# Patient Record
Sex: Male | Born: 1960 | Race: White | Hispanic: No | Marital: Married | Smoking: Never smoker
Health system: Southern US, Community
[De-identification: ages and names within clinical notes are randomized; demographics above are authoritative.]

## PROBLEM LIST (undated history)

## (undated) DIAGNOSIS — E559 Vitamin D deficiency, unspecified: Secondary | ICD-10-CM

## (undated) DIAGNOSIS — E785 Hyperlipidemia, unspecified: Secondary | ICD-10-CM

## (undated) DIAGNOSIS — M722 Plantar fascial fibromatosis: Secondary | ICD-10-CM

## (undated) DIAGNOSIS — E119 Type 2 diabetes mellitus without complications: Secondary | ICD-10-CM

## (undated) DIAGNOSIS — N529 Male erectile dysfunction, unspecified: Secondary | ICD-10-CM

## (undated) HISTORY — PX: COLONOSCOPY: SHX174

---

## 2009-09-12 ENCOUNTER — Ambulatory Visit: Payer: Self-pay | Admitting: Gastroenterology

## 2019-07-21 ENCOUNTER — Ambulatory Visit: Payer: Self-pay

## 2019-07-21 ENCOUNTER — Other Ambulatory Visit: Payer: Self-pay

## 2019-07-21 VITALS — BP 142/88 | HR 95 | Resp 18 | Ht 65.0 in | Wt 204.0 lb

## 2019-07-21 DIAGNOSIS — Z008 Encounter for other general examination: Secondary | ICD-10-CM

## 2019-07-21 LAB — POCT LIPID PANEL
HDL: 38
LDL: 54
Non-HDL: 74
POC Glucose: 174 mg/dl — AB (ref 70–99)
TC/HDL: 3
TC: 111
TRG: 99

## 2019-07-21 NOTE — Progress Notes (Signed)
     Patient ID: Jeremiah Perry, male    DOB: 27-Feb-1961, 58 y.o.   MRN: [id number]    Thank you!!  Apolonio Schneiders RN  [place] [place]: [phone]  Cell:  [phone] Website: [email or web address]

## 2020-05-11 ENCOUNTER — Other Ambulatory Visit: Admission: RE | Admit: 2020-05-11 | Payer: BC Managed Care – PPO | Source: Ambulatory Visit

## 2020-05-12 ENCOUNTER — Other Ambulatory Visit
Admission: RE | Admit: 2020-05-12 | Discharge: 2020-05-12 | Disposition: A | Discharge status: Home / Self Care | Payer: BC Managed Care – PPO | Source: Ambulatory Visit | Attending: Gastroenterology | Admitting: Gastroenterology

## 2020-05-12 ENCOUNTER — Other Ambulatory Visit: Payer: Self-pay

## 2020-05-12 DIAGNOSIS — Z20822 Contact with and (suspected) exposure to covid-19: Secondary | ICD-10-CM | POA: Diagnosis not present

## 2020-05-12 DIAGNOSIS — Z01812 Encounter for preprocedural laboratory examination: Secondary | ICD-10-CM | POA: Insufficient documentation

## 2020-05-12 LAB — SARS CORONAVIRUS 2 (TAT 6-24 HRS): SARS Coronavirus 2: NEGATIVE

## 2020-05-13 ENCOUNTER — Encounter
Admission: RE | Disposition: A | Discharge status: Home / Self Care | Payer: Self-pay | Source: Home / Self Care | Attending: Gastroenterology

## 2020-05-13 ENCOUNTER — Other Ambulatory Visit: Payer: Self-pay

## 2020-05-13 ENCOUNTER — Ambulatory Visit: Payer: BC Managed Care – PPO | Admitting: Certified Registered Nurse Anesthetist

## 2020-05-13 ENCOUNTER — Ambulatory Visit
Admission: RE | Admit: 2020-05-13 | Discharge: 2020-05-13 | Disposition: A | Discharge status: Home / Self Care | Payer: BC Managed Care – PPO | Attending: Gastroenterology | Admitting: Gastroenterology

## 2020-05-13 ENCOUNTER — Encounter: Payer: Self-pay | Admitting: *Deleted

## 2020-05-13 DIAGNOSIS — E119 Type 2 diabetes mellitus without complications: Secondary | ICD-10-CM | POA: Diagnosis not present

## 2020-05-13 DIAGNOSIS — N529 Male erectile dysfunction, unspecified: Secondary | ICD-10-CM | POA: Insufficient documentation

## 2020-05-13 DIAGNOSIS — D122 Benign neoplasm of ascending colon: Secondary | ICD-10-CM | POA: Insufficient documentation

## 2020-05-13 DIAGNOSIS — K640 First degree hemorrhoids: Secondary | ICD-10-CM | POA: Insufficient documentation

## 2020-05-13 DIAGNOSIS — Z7984 Long term (current) use of oral hypoglycemic drugs: Secondary | ICD-10-CM | POA: Insufficient documentation

## 2020-05-13 DIAGNOSIS — Z79899 Other long term (current) drug therapy: Secondary | ICD-10-CM | POA: Diagnosis not present

## 2020-05-13 DIAGNOSIS — E785 Hyperlipidemia, unspecified: Secondary | ICD-10-CM | POA: Diagnosis not present

## 2020-05-13 DIAGNOSIS — Z1211 Encounter for screening for malignant neoplasm of colon: Secondary | ICD-10-CM | POA: Insufficient documentation

## 2020-05-13 DIAGNOSIS — D123 Benign neoplasm of transverse colon: Secondary | ICD-10-CM | POA: Insufficient documentation

## 2020-05-13 HISTORY — DX: Male erectile dysfunction, unspecified: N52.9

## 2020-05-13 HISTORY — DX: Plantar fascial fibromatosis: M72.2

## 2020-05-13 HISTORY — DX: Hyperlipidemia, unspecified: E78.5

## 2020-05-13 HISTORY — DX: Vitamin D deficiency, unspecified: E55.9

## 2020-05-13 HISTORY — PX: COLONOSCOPY: SHX5424

## 2020-05-13 HISTORY — DX: Type 2 diabetes mellitus without complications: E11.9

## 2020-05-13 LAB — GLUCOSE, CAPILLARY: Glucose-Capillary: 97 mg/dL (ref 70–99)

## 2020-05-13 SURGERY — COLONOSCOPY
Anesthesia: General

## 2020-05-13 MED ORDER — PROPOFOL 500 MG/50ML IV EMUL
INTRAVENOUS | Status: DC | PRN
  Administered 2020-05-13: 100 ug/kg/min via INTRAVENOUS

## 2020-05-13 MED ORDER — PROPOFOL 10 MG/ML IV BOLUS
INTRAVENOUS | Status: DC | PRN
  Administered 2020-05-13: 50 mg via INTRAVENOUS

## 2020-05-13 MED ORDER — LIDOCAINE HCL (CARDIAC) PF 100 MG/5ML IV SOSY
PREFILLED_SYRINGE | INTRAVENOUS | Status: DC | PRN
  Administered 2020-05-13: 30 mg via INTRAVENOUS

## 2020-05-13 MED ORDER — SODIUM CHLORIDE 0.9 % IV SOLN: INTRAVENOUS | Status: DC

## 2020-05-13 MED ORDER — PROPOFOL 10 MG/ML IV BOLUS
INTRAVENOUS | Status: AC
  Filled 2020-05-13: qty 40

## 2020-05-13 NOTE — Interval H&P Note (Signed)
History and Physical Interval Note:  05/13/2020 12:37 PM  Jeremiah Perry  has presented today for surgery, with the diagnosis of COLON CANCER SCREENING.  The various methods of treatment have been discussed with the patient and family. After consideration of risks, benefits and other options for treatment, the patient has consented to  Procedure(s): COLONOSCOPY (N/A) as a surgical intervention.  The patient's history has been reviewed, patient examined, no change in status, stable for surgery.  I have reviewed the patient's chart and labs.  Questions were answered to the patient's satisfaction.     Lesly Rubenstein  Ok to proceed with colonoscopy

## 2020-05-13 NOTE — Interval H&P Note (Signed)
History and Physical Interval Note:  05/13/2020 12:28 PM  Jeremiah Perry  has presented today for surgery, with the diagnosis of COLON CANCER SCREENING.  The various methods of treatment have been discussed with the patient and family. After consideration of risks, benefits and other options for treatment, the patient has consented to  Procedure(s): COLONOSCOPY (N/A) as a surgical intervention.  The patient's history has been reviewed, patient examined, no change in status, stable for surgery.  I have reviewed the patient's chart and labs.  Questions were answered to the patient's satisfaction.     Lesly Rubenstein  Ok to proceed with colonoscopy.

## 2020-05-13 NOTE — Transfer of Care (Signed)
Immediate Anesthesia Transfer of Care Note  Patient: Jeremiah Perry  Procedure(s) Performed: COLONOSCOPY (N/A )  Patient Location: PACU  Anesthesia Type:General  Level of Consciousness: awake, alert  and oriented  Airway & Oxygen Therapy: Patient Spontanous Breathing  Post-op Assessment: Report given to RN  Post vital signs: Reviewed and stable  Last Vitals:  Vitals Value Taken Time  BP    Temp    Pulse 84 05/13/20 1315  Resp 11 05/13/20 1315  SpO2 98 % 05/13/20 1315  Vitals shown include unvalidated device data.  Last Pain:  Vitals:   05/13/20 1313  TempSrc: (P) Temporal  PainSc:          Complications: No complications documented.

## 2020-05-13 NOTE — Op Note (Signed)
[facility] Patient Name: Jeremiah Perry Procedure Date: 05/13/2020 12:46 PM MRN: [id number] Account #: [id number] Date of Birth: [birth year] Admit Type: Outpatient Age: 59 Room: [facility] ENDO ROOM 3 Gender: Male Note Status: Finalized Procedure:             Colonoscopy Indications:           Screening for colorectal malignant neoplasm Providers:             Andrey Farmer MD, MD Referring MD:          No Local Md, MD (Referring MD) Medicines:             Monitored Anesthesia Care Complications:         No immediate complications. Estimated blood loss:                         Minimal. Procedure:             Pre-Anesthesia Assessment:                        - Prior to the procedure, a History and Physical was                         performed, and patient medications and allergies were                         reviewed. The patient is competent. The risks and                         benefits of the procedure and the sedation options and                         risks were discussed with the patient. All questions                         were answered and informed consent was obtained.                         Patient identification and proposed procedure were                         verified by the physician, the nurse, the anesthetist                         and the technician in the endoscopy suite. Mental                         Status Examination: alert and oriented. Airway                         Examination: normal oropharyngeal airway and neck                         mobility. Respiratory Examination: clear to                         auscultation. CV Examination: normal. Prophylactic  Antibiotics: The patient does not require prophylactic                         antibiotics. Prior Anticoagulants: The patient has                         taken no previous anticoagulant or antiplatelet                         agents. ASA  Grade Assessment: II - A patient with mild                         systemic disease. After reviewing the risks and                         benefits, the patient was deemed in satisfactory                         condition to undergo the procedure. The anesthesia                         plan was to use monitored anesthesia care (MAC).                         Immediately prior to administration of medications,                         the patient was re-assessed for adequacy to receive                         sedatives. The heart rate, respiratory rate, oxygen                         saturations, blood pressure, adequacy of pulmonary                         ventilation, and response to care were monitored                         throughout the procedure. The physical status of the                         patient was re-assessed after the procedure.                        After obtaining informed consent, the colonoscope was                         passed under direct vision. Throughout the procedure,                         the patient's blood pressure, pulse, and oxygen                         saturations were monitored continuously. The                         Colonoscope was introduced through the anus and  advanced to the the cecum, identified by appendiceal                         orifice and ileocecal valve. The colonoscopy was                         performed without difficulty. The patient tolerated                         the procedure well. The quality of the bowel                         preparation was good. Findings:      The perianal and digital rectal examinations were normal.      A 2 mm polyp was found in the ascending colon. The polyp was sessile.       The polyp was removed with a jumbo cold forceps. Resection and retrieval       were complete. Estimated blood loss was minimal.      A 1 mm polyp was found in the transverse colon. The polyp was  sessile.       The polyp was removed with a jumbo cold forceps. Resection and retrieval       were complete. Estimated blood loss was minimal.      A 1 mm polyp was found in the mid transverse colon. The polyp was       sessile. The polyp was removed with a jumbo cold forceps. Resection and       retrieval were complete. Estimated blood loss was minimal.      Non-bleeding internal hemorrhoids were found during retroflexion. The       hemorrhoids were Grade I (internal hemorrhoids that do not prolapse).      The exam was otherwise without abnormality on direct and retroflexion       views. Impression:            - One 2 mm polyp in the ascending colon, removed with                         a jumbo cold forceps. Resected and retrieved.                        - One 1 mm polyp in the transverse colon, removed with                         a jumbo cold forceps. Resected and retrieved.                        - One 1 mm polyp in the mid transverse colon, removed                         with a jumbo cold forceps. Resected and retrieved.                        - Non-bleeding internal hemorrhoids.                        - The examination was otherwise normal on direct and  retroflexion views. Recommendation:        - Discharge patient to home.                        - Resume previous diet.                        - Continue present medications.                        - Await pathology results.                        - Repeat colonoscopy date to be determined after                         pending pathology results are reviewed for                         surveillance based on pathology results.                        - Return to referring physician as previously                         scheduled. Procedure Code(s):     --- Professional ---                        [phone], Colonoscopy, flexible; with biopsy, single or                         multiple Diagnosis Code(s):     ---  Professional ---                        Z12.11, Encounter for screening for malignant neoplasm                         of colon                        K63.5, Polyp of colon                        K64.0, First degree hemorrhoids CPT copyright 2019 American Medical Association. All rights reserved. The codes documented in this report are preliminary and upon coder review may  be revised to meet current compliance requirements. Andrey Farmer, MD Andrey Farmer MD, MD 05/13/2020 1:13:28 PM Number of Addenda: 0 Note Initiated On: 05/13/2020 12:46 PM Scope Withdrawal Time: 0 hours 14 minutes 24 seconds  Total Procedure Duration: 0 hours 18 minutes 4 seconds  Estimated Blood Loss:  Estimated blood loss was minimal.      [facility]

## 2020-05-13 NOTE — Anesthesia Postprocedure Evaluation (Signed)
Anesthesia Post Note  Patient: Jeremiah Perry  Procedure(s) Performed: COLONOSCOPY (N/A )  Patient location during evaluation: Endoscopy Anesthesia Type: General Level of consciousness: awake and alert and oriented Pain management: pain level controlled Vital Signs Assessment: post-procedure vital signs reviewed and stable Respiratory status: spontaneous breathing, nonlabored ventilation and respiratory function stable Cardiovascular status: blood pressure returned to baseline and stable Postop Assessment: no signs of nausea or vomiting Anesthetic complications: no   No complications documented.   Last Vitals:  Vitals:   05/13/20 1313 05/13/20 1323  BP: 105/69 124/73  Pulse:    Resp: 16   Temp: 36.7 C   SpO2:      Last Pain:  Vitals:   05/13/20 1323  TempSrc:   PainSc: 0-No pain                 Miquel Lamson

## 2020-05-13 NOTE — Anesthesia Preprocedure Evaluation (Signed)
Anesthesia Evaluation  Patient identified by MRN, date of birth, ID band Patient awake    Reviewed: Allergy & Precautions, NPO status , Patient's Chart, lab work & pertinent test results  History of Anesthesia Complications Negative for: history of anesthetic complications  Airway Mallampati: II  TM Distance: >3 FB Neck ROM: Full    Dental no notable dental hx.    Pulmonary neg pulmonary ROS, neg sleep apnea, neg COPD,    breath sounds clear to auscultation- rhonchi (-) wheezing      Cardiovascular Exercise Tolerance: Good (-) hypertension(-) CAD, (-) Past MI, (-) Cardiac Stents and (-) CABG  Rhythm:Regular Rate:Normal - Systolic murmurs and - Diastolic murmurs    Neuro/Psych neg Seizures negative neurological ROS  negative psych ROS   GI/Hepatic negative GI ROS, Neg liver ROS,   Endo/Other  diabetes, Oral Hypoglycemic Agents  Renal/GU negative Renal ROS     Musculoskeletal negative musculoskeletal ROS (+)   Abdominal (+) + obese,   Peds  Hematology negative hematology ROS (+)   Anesthesia Other Findings Past Medical History: No date: Diabetes mellitus without complication (HCC) No date: ED (erectile dysfunction) No date: Hyperlipidemia No date: Hyperlipidemia No date: Plantar fasciitis No date: Vitamin D deficiency   Reproductive/Obstetrics                             Anesthesia Physical Anesthesia Plan  ASA: II  Anesthesia Plan: General   Post-op Pain Management:    Induction: Intravenous  PONV Risk Score and Plan: 1 and Propofol infusion  Airway Management Planned: Natural Airway  Additional Equipment:   Intra-op Plan:   Post-operative Plan:   Informed Consent: I have reviewed the patients History and Physical, chart, labs and discussed the procedure including the risks, benefits and alternatives for the proposed anesthesia with the patient or authorized  representative who has indicated his/her understanding and acceptance.     Dental advisory given  Plan Discussed with: CRNA and Anesthesiologist  Anesthesia Plan Comments:         Anesthesia Quick Evaluation

## 2020-05-13 NOTE — H&P (Signed)
Outpatient short stay form Pre-procedure 05/13/2020 12:25 PM Raylene Miyamoto MD, MPH  Primary Physician: None listed  Reason for visit:  Screening Colonoscopy  History of present illness:   59 y/o gentleman here for screening colonoscopy. No blood thinners. No family history of GI malignancies.    Current Facility-Administered Medications:  .  0.9 %  sodium chloride infusion, , Intravenous, Continuous, Cavan Bearden, Hilton Cork, MD  Medications Prior to Admission  Medication Sig Dispense Refill Last Dose  . APOAEQUORIN PO Take by mouth daily.     . CHOLECALCIFEROL PO Take 200 Units by mouth daily.     . hydrochlorothiazide (HYDRODIURIL) 12.5 MG tablet Take 12.5 mg by mouth daily.     Marland Kitchen losartan (COZAAR) 100 MG tablet Take 100 mg by mouth daily.     . metFORMIN (GLUCOPHAGE-XR) 500 MG 24 hr tablet Take 500 mg by mouth daily with breakfast.     . sildenafil (REVATIO) 20 MG tablet Take 20 mg by mouth 3 (three) times daily.     . simvastatin (ZOCOR) 40 MG tablet Take 40 mg by mouth daily.        Not on File   Past Medical History:  Diagnosis Date  . Diabetes mellitus without complication ([facility])   . ED (erectile dysfunction)   . Hyperlipidemia   . Plantar fasciitis   . Vitamin D deficiency     Review of systems:  Otherwise negative.    Physical Exam  Gen: Alert, oriented. Appears stated age.  HEENT: [place]/AT. PERRLA. Lungs: No respiratory distress Abd: soft, benign, no masses. Ext: No edema.    Planned procedures: Proceed with colonoscopy. The patient understands the nature of the planned procedure, indications, risks, alternatives and potential complications including but not limited to bleeding, infection, perforation, damage to internal organs and possible oversedation/side effects from anesthesia. The patient agrees and gives consent to proceed.  Please refer to procedure notes for findings, recommendations and patient disposition/instructions.     Raylene Miyamoto MD,  [facility] 05/13/2020  12:25 PM

## 2020-05-16 ENCOUNTER — Encounter: Payer: Self-pay | Admitting: Gastroenterology

## 2020-05-16 LAB — SURGICAL PATHOLOGY

## 2021-12-26 ENCOUNTER — Other Ambulatory Visit: Payer: Self-pay | Admitting: Family Medicine

## 2021-12-26 DIAGNOSIS — M5416 Radiculopathy, lumbar region: Secondary | ICD-10-CM

## 2022-01-03 ENCOUNTER — Ambulatory Visit
Admission: RE | Admit: 2022-01-03 | Discharge: 2022-01-03 | Disposition: A | Discharge status: Home / Self Care | Payer: BC Managed Care – PPO | Source: Ambulatory Visit | Attending: Family Medicine | Admitting: Family Medicine

## 2022-01-03 DIAGNOSIS — M5416 Radiculopathy, lumbar region: Secondary | ICD-10-CM

## 2022-01-24 ENCOUNTER — Ambulatory Visit: Payer: BC Managed Care – PPO | Admitting: Urology

## 2022-01-24 ENCOUNTER — Encounter: Payer: Self-pay | Admitting: Urology

## 2022-01-24 VITALS — BP 134/81 | HR 102 | Ht 65.0 in | Wt 197.2 lb

## 2022-01-24 DIAGNOSIS — N529 Male erectile dysfunction, unspecified: Secondary | ICD-10-CM

## 2022-01-24 DIAGNOSIS — R972 Elevated prostate specific antigen [PSA]: Secondary | ICD-10-CM

## 2022-01-24 NOTE — Progress Notes (Signed)
? ?  01/24/22 ?2:39 PM  ? ?Jeremiah Perry ?April 10, 1961 ?[id number] ? ?CC: Elevated PSA, ED ? ?HPI: ?I saw Jeremiah Perry today for evaluation of elevated PSA.  He denies any family history of prostate cancer, reportedly has a history of BPH that required surgery in his brother.  Most recent PSA on 12/15/2021 was elevated at 4.98, which had increased from 3.86 in September 2021, and 2.58 in August 2019.  He denies any urinary symptoms or gross hematuria.  He has ED well-controlled on sildenafil.  He is originally from [place]. ? ? ?PMH: ?Past Medical History:  ?Diagnosis Date  ? Diabetes mellitus without complication ([facility])   ? ED (erectile dysfunction)   ? Hyperlipidemia   ? Hyperlipidemia   ? Plantar fasciitis   ? Vitamin D deficiency   ? ? ?Surgical History: ?Past Surgical History:  ?Procedure Laterality Date  ? COLONOSCOPY    ? COLONOSCOPY N/A 05/13/2020  ? Procedure: COLONOSCOPY;  Surgeon: Lesly Rubenstein, MD;  Location[facility]COPY;  Service: Endoscopy;  Laterality: N/A;  ? ? ?Social History:  reports that he has never smoked. He does not have any smokeless tobacco history on file. He reports that he does not drink alcohol and does not use drugs. ? ?Physical Exam: ?BP 134/81 (BP Location: Left Arm, Patient Position: Sitting, Cuff Size: Large)   Pulse (!) 102   Ht '5\' 5"'$  (1.651 m)   Wt 197 lb 3.2 oz (89.4 kg)   BMI 32.82 kg/m?   ? ?Constitutional:  Alert and oriented, No acute distress. ?Cardiovascular: No clubbing, cyanosis, or edema. ?Respiratory: Normal respiratory effort, no increased work of breathing. ?GI: Abdomen is soft, nontender, nondistended, no abdominal masses ?DRE: 60 g, smooth, no nodules or masses ? ?Laboratory Data: ?Reviewed, see HPI ? ?Assessment & Plan:   ?61 year old male with no family history of prostate cancer, mildly elevated PSA of 4.98 which is slowly increased over the last few years, normal DRE. ? ?We reviewed the implications of an elevated PSA and the uncertainty  surrounding it. In general, a man's PSA increases with age and is produced by both normal and cancerous prostate tissue. The differential diagnosis for elevated PSA includes BPH, prostate cancer, infection, recent intercourse/ejaculation, recent urethroscopic manipulation (foley placement/cystoscopy) or trauma, and prostatitis.  ? ?Management of an elevated PSA can include observation or prostate biopsy and we discussed this in detail. Our goal is to detect clinically significant prostate cancers, and manage with either active surveillance, surgery, or radiation for localized disease. Risks of prostate biopsy include bleeding, infection (including life threatening sepsis), pain, and lower urinary symptoms. Hematuria, hematospermia, and blood in the stool are all common after biopsy and can persist up to 4 weeks.  ? ?We discussed options of repeat PSA with reflex to free, prostate MRI, or prostate biopsy.  Using shared decision making he opted for a prostate MRI, and understands possible need for biopsy pending MRI findings. ? ? ?Nickolas Madrid, MD ?01/24/2022 ? ?[place] ?[address] ?[place], [place] [address] ?[phone] ? ?

## 2022-01-24 NOTE — Patient Instructions (Signed)

## 2024-01-22 ENCOUNTER — Ambulatory Visit: Admitting: Urology

## 2024-01-22 VITALS — BP 147/79 | HR 71 | Ht 65.0 in | Wt 194.2 lb

## 2024-01-22 DIAGNOSIS — N529 Male erectile dysfunction, unspecified: Secondary | ICD-10-CM

## 2024-01-22 DIAGNOSIS — R972 Elevated prostate specific antigen [PSA]: Secondary | ICD-10-CM

## 2024-01-22 DIAGNOSIS — R399 Unspecified symptoms and signs involving the genitourinary system: Secondary | ICD-10-CM | POA: Diagnosis not present

## 2024-01-22 NOTE — Progress Notes (Signed)
   01/22/24 2:49 PM   Jeremiah Perry 1961-06-03 [id number]  CC: Elevated PSA, ED, urinary symptoms  HPI: 63 year old male with persistently rising PSA over the last few years.  PSA was normal at 2.6 in August 2019, 3.86 September 2021, 4.98 March 2023, 6.08 January 2023, 5.76 October 2024, and most recently 9.65 in April 2025.  Mild urinary symptoms of urgency during the day, nocturia 0-1 time at night.  He has ED well-controlled with sildenafil. He has never been evaluated by urology before.  No prior cross-sectional imaging to review.  No prior abdominal surgeries.   PMH: Past Medical History:  Diagnosis Date   Diabetes mellitus without complication (HCC)    ED (erectile dysfunction)    Hyperlipidemia    Hyperlipidemia    Plantar fasciitis    Vitamin D deficiency     Surgical History: Past Surgical History:  Procedure Laterality Date   COLONOSCOPY     COLONOSCOPY N/A 05/13/2020   Procedure: COLONOSCOPY;  Surgeon: Shane Darling, MD;  Location: [facility] ENDOSCOPY;  Service: Endoscopy;  Laterality: N/A;    Social History:  reports that he has never smoked. He does not have any smokeless tobacco history on file. He reports that he does not drink alcohol and does not use drugs.  Physical Exam: BP (!) 147/79 (BP Location: Left Arm, Patient Position: Sitting, Cuff Size: Normal)   Pulse 71   Ht 5\' 5"  (1.651 m)   Wt 194 lb 3.2 oz (88.1 kg)   SpO2 98%   BMI 32.32 kg/m    Constitutional:  Alert and oriented, No acute distress. Cardiovascular: No clubbing, cyanosis, or edema. Respiratory: Normal respiratory effort, no increased work of breathing. GI: Abdomen is soft, nontender, nondistended, no abdominal masses   Laboratory Data: PSA history reviewed, see HPI  Pertinent Imaging: None to review  Assessment & Plan:   63 year old relatively healthy male with persistently rising PSA over the last 2 years, most recently 9.65.  We reviewed the implications of an elevated  PSA and the uncertainty surrounding it. In general, a man's PSA increases with age and is produced by both normal and cancerous prostate tissue. The differential diagnosis for elevated PSA includes BPH, prostate cancer, infection/prostatitis, recent intercourse/ejaculation, recent urethroscopic manipulation (foley placement/cystoscopy) or trauma. Management of an elevated PSA can include observation/surveillance, prostate MRI, or prostate biopsy and we discussed this in detail. Our goal is to detect clinically significant prostate cancers, and manage with either active surveillance, surgery, or radiation for localized disease. Risks of prostate biopsy include bleeding, infection (including life threatening sepsis), pain, and lower urinary symptoms. Hematuria, hematospermia, and blood in the stool are all common after biopsy and can persist up to 4 weeks.  Schedule prostate biopsy  Jay Meth, MD 01/22/2024  [facility] [address], [address] [place], [place] [address] [phone]

## 2024-01-22 NOTE — Patient Instructions (Signed)
 Prostate Biopsy Instructions  Stop all aspirin or blood thinners (aspirin, plavix, coumadin, warfarin, motrin, ibuprofen, advil, aleve, naproxen, naprosyn) for 7 days prior to the procedure.  If you have any questions about stopping these medications, please contact your primary care physician or cardiologist.  Having a light meal prior to the procedure is recommended.  If you are diabetic or have low blood sugar please bring a small snack or glucose tablet.  A Fleets enema is needed to be purchased over the counter at a local pharmacy and used 2 hours before you scheduled appointment.  This can be purchased over the counter at any pharmacy.  Antibiotics will be administered in the clinic at the time of the procedure unless otherwise specified.    Please bring someone with you to the procedure to drive you home.  A follow up appointment has been scheduled for you to receive the results of the biopsy.  If you have any questions or concerns, please feel free to call the office at [phone] or send a Mychart message.    Thank you, Staff at [facility]  Prostate Cancer Screening  Prostate cancer screening is testing that is done to check for the presence of prostate cancer in men. The prostate gland is a walnut-sized gland that is located below the bladder and in front of the rectum in males. The function of the prostate is to add fluid to semen during ejaculation. Prostate cancer is one of the most common types of cancer in men. Who should have prostate cancer screening? Screening recommendations vary based on age and other risk factors, as well as between the professional organizations who make the recommendations. In general, screening is recommended if: You are age 72 to 46 and have an average risk for prostate cancer. You should talk with your health care provider about your need for screening and how often screening should be done. Because most prostate cancers are slow  growing and will not cause death, screening in this age group is generally reserved for men who have a 10- to 15-year life expectancy. You are younger than age 59, and you have these risk factors: Having a father, brother, or uncle who has been diagnosed with prostate cancer. The risk is higher if your family member's cancer occurred at an early age or if you have multiple family members with prostate cancer at an early age. Being a male who is [place] or is of [place] or sub-Saharan African descent. In general, screening is not recommended if: You are younger than age 71. You are between the ages of 50 and 18 and you have no risk factors. You are 66 years of age or older. At this age, the risks that screening can cause are greater than the benefits that it may provide. If you are at high risk for prostate cancer, your health care provider may recommend that you have screenings more often or that you start screening at a younger age. How is screening for prostate cancer done? The recommended prostate cancer screening test is a blood test called the prostate-specific antigen (PSA) test. PSA is a protein that is made in the prostate. As you age, your prostate naturally produces more PSA. Abnormally high PSA levels may be caused by: Prostate cancer. An enlarged prostate that is not caused by cancer (benign prostatic hyperplasia, or BPH). This condition is very common in older men. A prostate gland infection (prostatitis) or urinary tract infection. Certain medicines such as male hormones (like testosterone)  or other medicines that raise testosterone levels. A rectal exam may be done as part of prostate cancer screening to help provide information about the size of your prostate gland. When a rectal exam is performed, it should be done after the PSA level is drawn to avoid any effect on the results. Depending on the PSA results, you may need more tests, such as: A physical exam to check the size of your  prostate gland, if not done as part of screening. Blood and imaging tests. A procedure to remove tissue samples from your prostate gland for testing (biopsy). This is the only way to know for certain if you have prostate cancer. What are the benefits of prostate cancer screening? Screening can help to identify cancer at an early stage, before symptoms start and when the cancer can be treated more easily. There is a small chance that screening may lower your risk of dying from prostate cancer. The chance is small because prostate cancer is a slow-growing cancer, and most men with prostate cancer die from a different cause. What are the risks of prostate cancer screening? The main risk of prostate cancer screening is diagnosing and treating prostate cancer that would never have caused any symptoms or problems. This is called overdiagnosisand overtreatment. PSA screening cannot tell you if your PSA is high due to cancer or a different cause. A prostate biopsy is the only procedure to diagnose prostate cancer. Even the results of a biopsy may not tell you if your cancer needs to be treated. Slow-growing prostate cancer may not need any treatment other than monitoring, so diagnosing and treating it may cause unnecessary stress or other side effects. Questions to ask your health care provider When should I start prostate cancer screening? What is my risk for prostate cancer? How often do I need screening? What type of screening tests do I need? How do I get my test results? What do my results mean? Do I need treatment? Where to find more information The American Cancer Society: www.cancer.org American Urological Association: www.auanet.org Contact a health care provider if: You have difficulty urinating. You have pain when you urinate or ejaculate. You have blood in your urine or semen. You have pain in your back or in the area of your prostate. Summary Prostate cancer is a common type of cancer  in men. The prostate gland is located below the bladder and in front of the rectum. This gland adds fluid to semen during ejaculation. Prostate cancer screening may identify cancer at an early stage, when the cancer can be treated more easily and is less likely to have spread to other areas of the body. The prostate-specific antigen (PSA) test is the recommended screening test for prostate cancer, but it has associated risks. Discuss the risks and benefits of prostate cancer screening with your health care provider. If you are age 100 or older, the risks that screening can cause are greater than the benefits that it may provide. This information is not intended to replace advice given to you by your health care provider. Make sure you discuss any questions you have with your health care provider. Document Revised: 03/13/2021 Document Reviewed: 03/13/2021 Elsevier Patient Education  2024 [employer]vier Inc.  Transrectal Ultrasound-Guided Prostate Biopsy An ultrasound is a test that uses sound waves to take pictures of the inside of the body. During this test, a small device (probe) with a gel is put inside your butt (rectum). The probe will take pictures of your prostate to  help guide the needle. A needle will be inserted to take samples of tissues for testing. This test may be done to check for changes in the prostate, like prostate swelling or cancer. Tell your doctor about: Any allergies you have. All medicines you are taking. Any problems you or family members have had with anesthetic medicines. Any bleeding problems you have. Any surgeries you have had. Any medical conditions you have. Any prostate infections you have had. What are the risks? Infection. Bleeding from the butt. Blood in the pee (urine). Allergic reactions to medicines. Damage to nearby parts. Trouble peeing. Nerve damage. This is usually temporary. What happens before the test? Medicines Ask your doctor about changing or  stopping: Your normal medicines. Vitamins, herbs, and supplements. Over-the-counter medicines. Do not take aspirin or ibuprofen unless you are told to. General instructions Follow instructions from your doctor about what you cannot eat or drink. Liquid will be used to clear waste from your butt (enema). You may have a blood sample taken. You may have a pee sample taken. For your safety, your doctor may: Ask you to wash with a soap that kills germs. Give you antibiotic medicine. If you will be going home right after the test, plan to have a responsible adult: Take you home from the hospital or clinic. You will not be allowed to drive. Care for you for the time you are told. What happens during the test?  An IV tube will be put into one of your veins. You will be given one or both of these: A medicine to help you relax. A medicine to numb the area. You will be placed on your left side. Your knees will be bent toward your chest. A probe with gel on it will be placed in your butt. Pictures will be taken of your prostate and the area around it. Medicine will be used to numb your prostate. A needle will be placed in your butt and moved to your prostate. Prostate tissue will be taken out. The needle and probe will be taken out. The samples will be sent to a lab. The procedure may vary among doctors and hospitals. What happens after the test? You will be watched until you leave the hospital or clinic. This includes checking your blood pressure, heart rate, breathing rate, and blood oxygen level. You may have some pain in your butt. You will be given medicine for it. If you were given a sedative during the test, do not drive or use machines until your doctor says that it is safe. A sedative is a medicine that helps you relax. It is up to you to get the results of your test. Ask how to get your results when they are ready. Summary This test is usually done to check for prostate  cancer. Before the test, ask your doctor about changing or stopping your medicines. You may have some pain in your butt. You will be given medicine for it. Plan to have a responsible adult take you home from the hospital or clinic. This information is not intended to replace advice given to you by your health care provider. Make sure you discuss any questions you have with your health care provider. Document Revised: 03/13/2021 Document Reviewed: 03/13/2021 Elsevier Patient Education  2024 [employer].

## 2024-01-24 IMAGING — MR MR LUMBAR SPINE W/O CM
4 of 5 series · 17 of 48 positions shown · non-contrast
Comparison: Report from lumbar spine radiographs 08/25/2021 (images
unavailable).

CLINICAL DATA: Lumbar radiculitis. Additional history provided by
scanning technologist: Patient reports low back pain. Pain on both
sides but worse in left side of back.

EXAM:
MRI LUMBAR SPINE WITHOUT CONTRAST
TECHNIQUE: Multiplanar, multisequence MR imaging of the lumbar spine was
performed. No intravenous contrast was administered.

[Series 6: T2 · sagittal · 4.0mm · 0.73mm/px · 6 of 15 slices shown (1 of 2)]
[im 1/15]
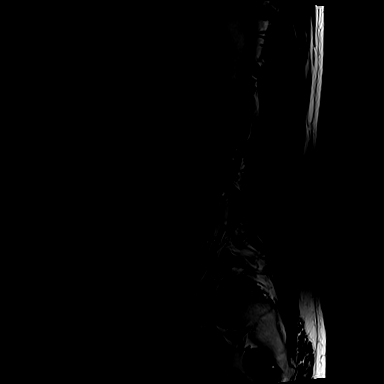
[im 3/15]
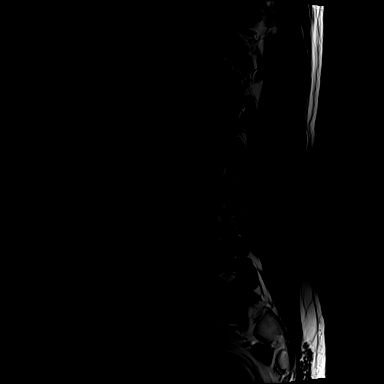
[im 6/15]
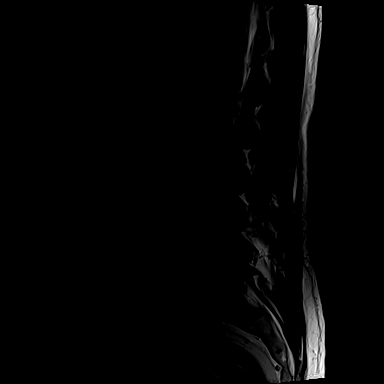
[im 9/15]
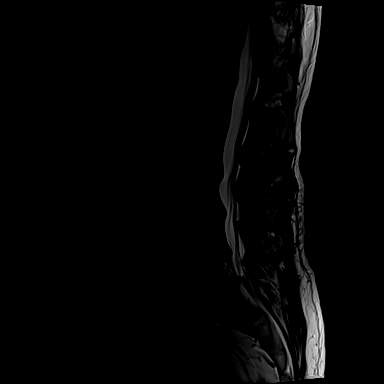
[im 12/15]
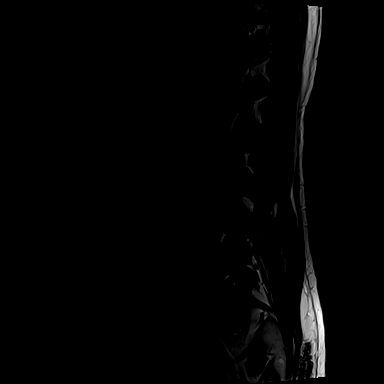
[im 15/15]
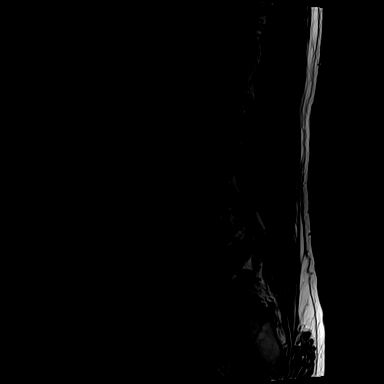

[Series 7: T1 · sagittal · 4.0mm · 0.73mm/px · 3 of 15 slices shown (1 of 2)]
[im 3/15]
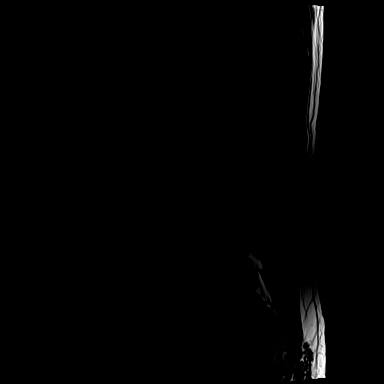
[im 9/15]
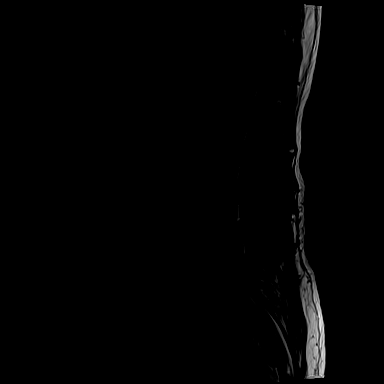
[im 15/15]
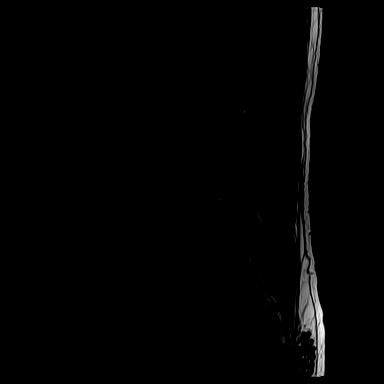

[Series 13: T2 · axial · 4.0mm · 0.28mm/px · z∈[-101,+84]mm · 5 of 39 slices shown (2 of 2)]
[im 1/39]
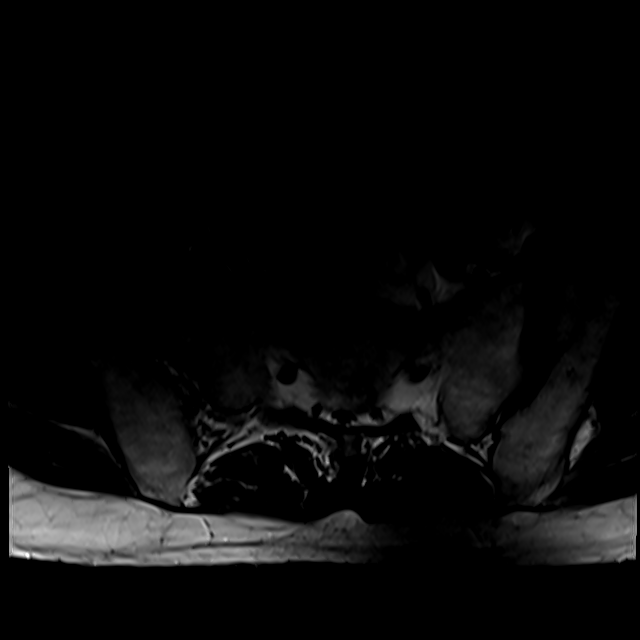
[im 6/39]
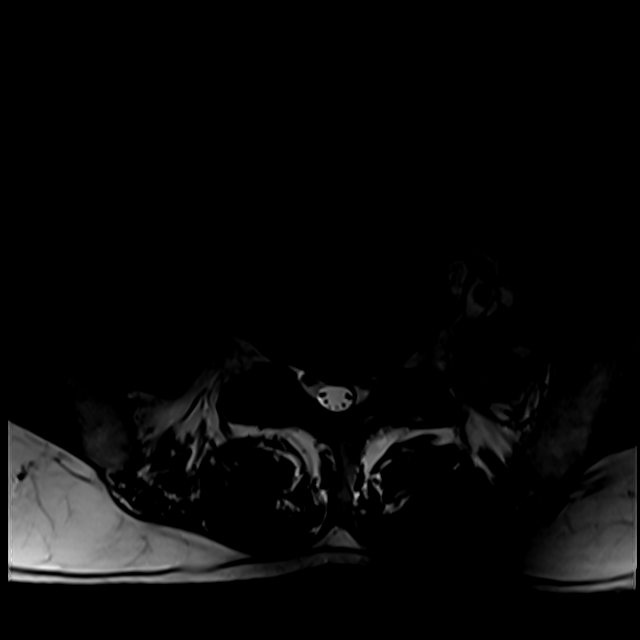
[im 11/39]
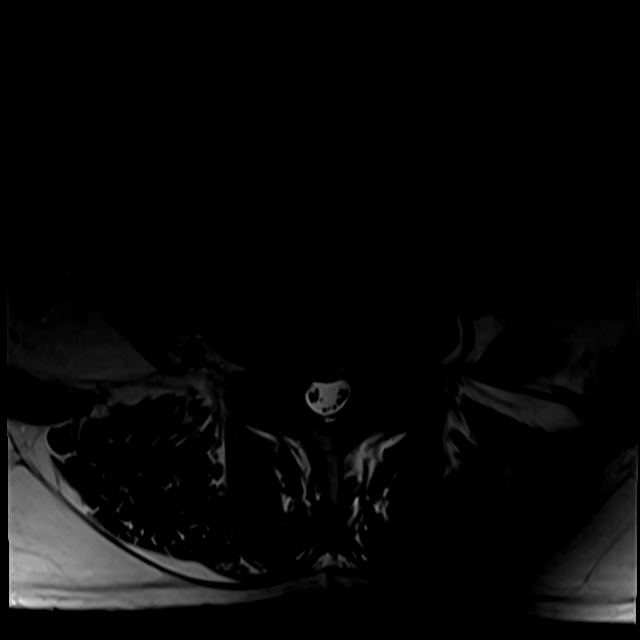
[im 20/39]
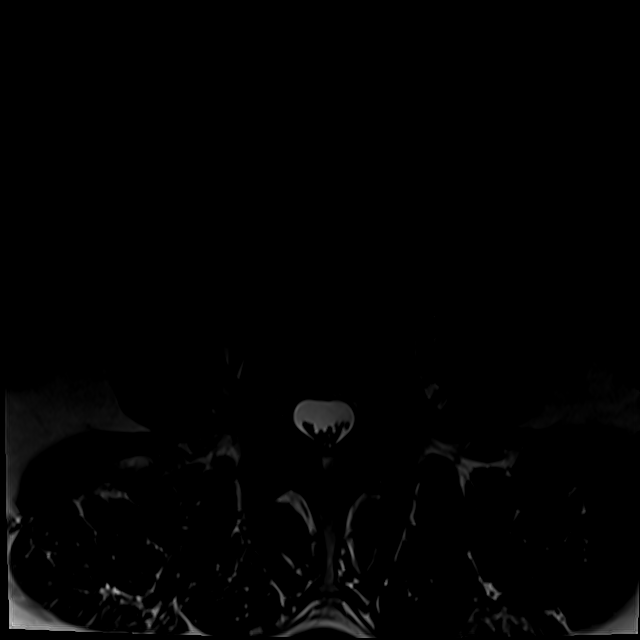
[im 33/39]
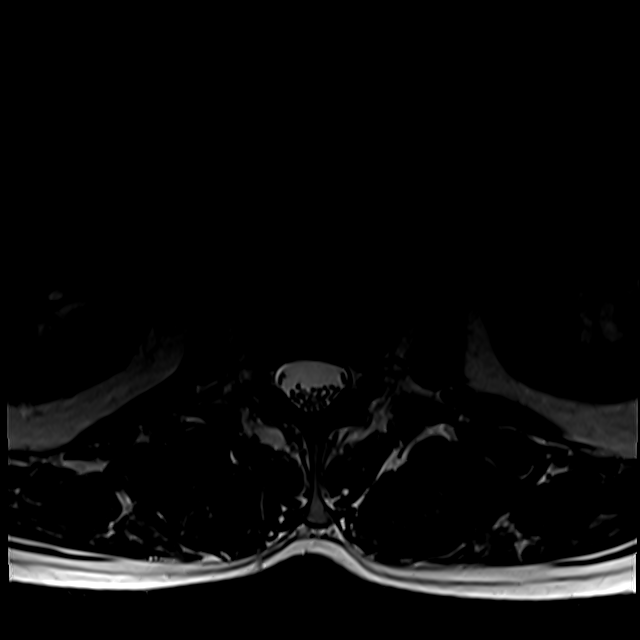

[Series 100: T1 · axial · 4.0mm · 0.28mm/px · z∈[-78,+84]mm · 3 of 39 slices shown (2 of 2)]
[im 6/39]
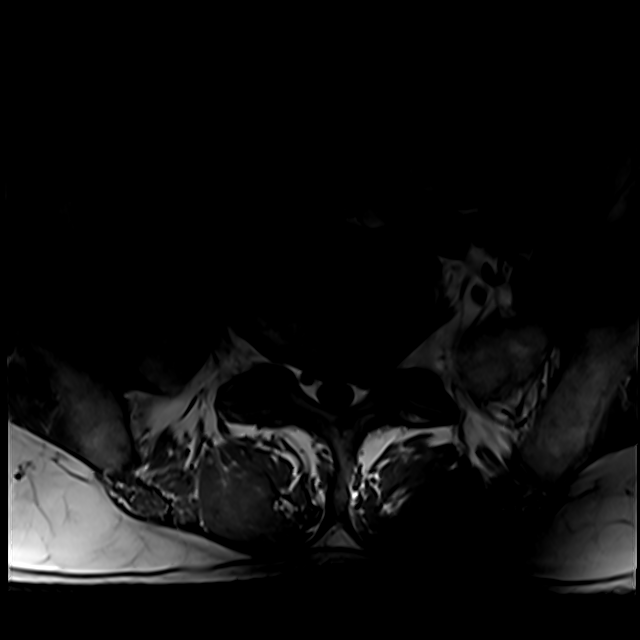
[im 20/39]
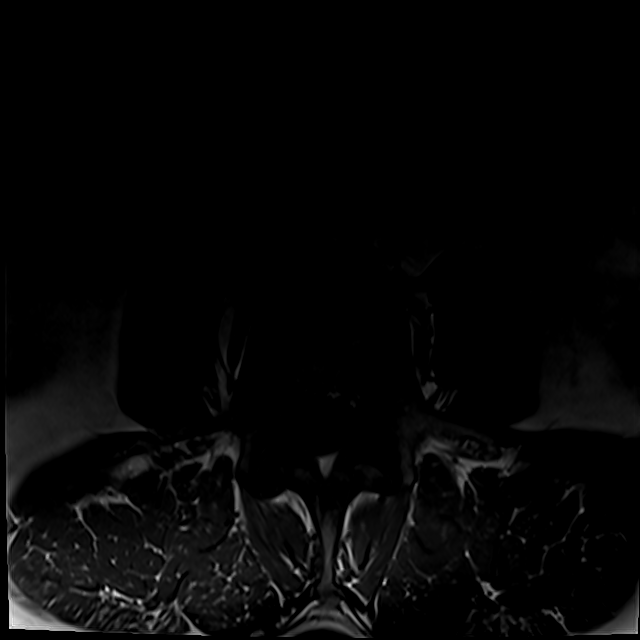
[im 33/39]
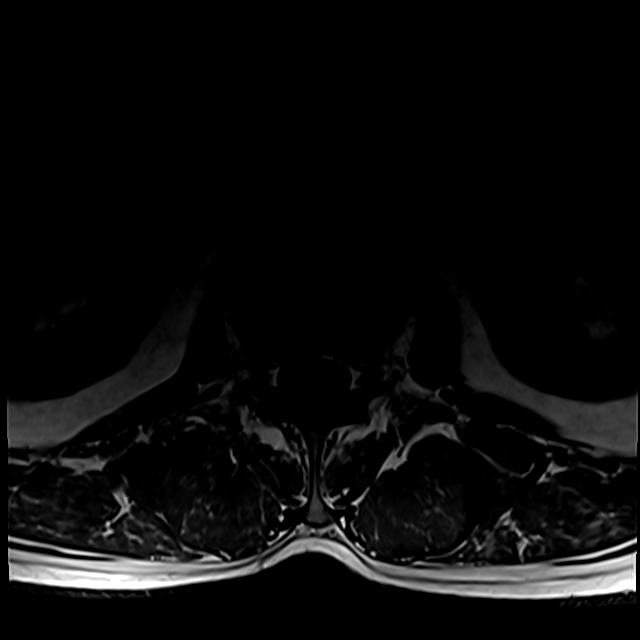

[17 of 48 positions shown; findings below may reference images not displayed]

FINDINGS: Segmentation: For the purposes of this dictation, five lumbar
vertebrae are assumed and the caudal most well-formed intervertebral
disc is designated L5-S1.

Alignment: Mild L2-L3 grade 1 retrolisthesis. Mild L4-L5 grade 1
retrolisthesis.

Vertebrae: No lumbar vertebral compression fracture. Fatty
degenerative endplate marrow signal at L4-L5. No significant marrow
edema or focal suspicious osseous lesion. Multilevel ventrolateral
osteophytes.

Conus medullaris and cauda equina: Conus extends to the T12-L1
level. No signal abnormality within the visualized distal spinal
cord.

Paraspinal and other soft tissues: No abnormality identified within
included portions of the abdomen/retroperitoneum. Paraspinal soft
tissues unremarkable.

Disc levels:

Multilevel disc degeneration, greatest at T11-T12 (moderate), L4-L5
(moderate to moderately advanced) and L5-S1 (moderate).

T11-T12: This level is imaged in the sagittal plane only. No
significant disc herniation or stenosis.

T12-L1: No significant disc herniation or stenosis.

L1-L2: Mild facet arthrosis (predominantly on the right). No
significant disc herniation or stenosis.

L2-L3: Mild grade 1 retrolisthesis. Central posterior annular
fissure. Disc bulge with endplate spurring/osteophytic ridging. Mild
facet arthrosis and ligamentum flavum hypertrophy. Mild bilateral
subarticular narrowing (with slight medialization of the descending
L3 nerve roots. No significant central canal stenosis. Bilateral
neural foraminal narrowing (mild to moderate right, moderate left).

L3-L4: Disc bulge with endplate spurring/osteophytic ridging. Facet
arthrosis (mild to moderate right, mild left) with ligamentum flavum
hypertrophy. Mild bilateral subarticular narrowing (without
appreciable nerve root impingement). Mild relative narrowing of the
central canal. Bilateral neural foraminal narrowing (moderate right,
mild left).

L4-L5: Mild grade 1 retrolisthesis. Disc bulge with endplate
spurring/osteophytic ridging. Mild facet arthrosis and ligamentum
flavum hypertrophy. Mild-to-moderate left greater than right
subarticular stenosis with crowding of the descending left L5 nerve
root (series 13, image 29). No significant central canal stenosis.
Moderate bilateral neural foraminal narrowing.

L5-S1: Disc bulge with endplate spurring/osteophytic ridging.
Superimposed broad-based central disc protrusion at site of
posterior annular fissure. Mild facet arthrosis. The disc protrusion
results in mild bilateral subarticular narrowing, contacting the
bilateral descending S1 nerve roots (series 13, image 33). No
significant central canal stenosis. Mild-to-moderate bilateral
neural foraminal narrowing.
IMPRESSION: Lumbar and lower thoracic spondylosis, as outlined and with findings
most notably as follows.

At L4-L5, there is moderate to moderately advanced disc
degeneration. Multifactorial mild to moderate left greater than
right subarticular stenosis with crowding of the descending left L5
nerve root. Moderate bilateral neural foraminal narrowing.

At L5-S1, a very shallow broad-based central disc protrusion (at
site of posterior annular fissure) results in mild bilateral
subarticular narrowing, contacting the bilateral descending S1 nerve
roots. Multifactorial mild to moderate bilateral neural foraminal
narrowing.

No more than mild spinal canal narrowing at the remaining levels.
Additional sites of foraminal stenosis, as detailed and greatest
bilaterally at L2-L3 (mild to moderate right, moderate left) and
bilaterally at L3-L4 (moderate right, mild left).

Mild grade 1 retrolisthesis at L2-L3 and L4-L5.

## 2024-02-04 ENCOUNTER — Other Ambulatory Visit: Admitting: Urology

## 2024-02-12 ENCOUNTER — Other Ambulatory Visit: Admitting: Urology

## 2024-02-18 ENCOUNTER — Ambulatory Visit: Admitting: Urology

## 2024-02-25 ENCOUNTER — Ambulatory Visit: Admitting: Urology

## 2024-02-25 VITALS — BP 160/79 | HR 55

## 2024-02-25 DIAGNOSIS — R972 Elevated prostate specific antigen [PSA]: Secondary | ICD-10-CM

## 2024-02-25 DIAGNOSIS — C61 Malignant neoplasm of prostate: Secondary | ICD-10-CM

## 2024-02-25 DIAGNOSIS — Z2989 Encounter for other specified prophylactic measures: Secondary | ICD-10-CM

## 2024-02-25 MED ORDER — LEVOFLOXACIN 500 MG PO TABS
500.0000 mg | ORAL_TABLET | Freq: Once | ORAL | Status: AC
  Administered 2024-02-25: 500 mg via ORAL

## 2024-02-25 MED ORDER — GENTAMICIN SULFATE 40 MG/ML IJ SOLN
80.0000 mg | Freq: Once | INTRAMUSCULAR | Status: AC
  Administered 2024-02-25: 80 mg via INTRAMUSCULAR

## 2024-02-25 NOTE — Patient Instructions (Signed)

## 2024-02-25 NOTE — Progress Notes (Signed)
   02/25/24  Indication: Elevated PSA, 9.65  Prostate Biopsy Procedure   Informed consent was obtained, and we discussed the risks of bleeding and infection/sepsis. A time out was performed to ensure correct patient identity.  Pre-Procedure: - Last PSA Level: 9.65 - Gentamicin  and levaquin  given for antibiotic prophylaxis - Transrectal Ultrasound performed revealing a 20 gm prostate, PSA density 0.48 - No significant hypoechoic or median lobe noted  Procedure: - Prostate block performed using 10 cc 1% lidocaine  and biopsies taken from sextant areas, a total of 12 under ultrasound guidance.  Post-Procedure: - Patient tolerated the procedure well - He was counseled to seek immediate medical attention if experiences significant bleeding, fevers, or severe pain - Return in one week to discuss biopsy results  Assessment/ Plan: Will follow up in 1-2 weeks to discuss pathology  Jay Meth, MD 02/25/2024

## 2024-03-03 ENCOUNTER — Ambulatory Visit: Admitting: Urology

## 2024-03-03 VITALS — BP 131/82 | HR 102 | Ht 66.0 in | Wt 200.0 lb

## 2024-03-03 DIAGNOSIS — C61 Malignant neoplasm of prostate: Secondary | ICD-10-CM | POA: Diagnosis not present

## 2024-03-03 NOTE — Progress Notes (Signed)
   03/03/2024 1:14 PM   Jeremiah Perry 1961-08-03 [id number]  Reason for visit: Discuss prostate biopsy results, new diagnosis of prostate cancer  HPI: Healthy 63 year old male with persistently rising PSA up to 9.65 who opted for prostate biopsy.  Prostate biopsy on 02/25/2024 showed a 20 g prostate with a PSA density of 0.48, and 4 cores on the right side showed Gleason score 3+3=6 grade group 1 low risk disease, max core involvement 43%.  We had a lengthy conversation today about the patient's new diagnosis of prostate cancer.  We reviewed the risk classifications per the AUA guidelines including very low risk, low risk, intermediate risk, and high risk disease, and the need for additional staging imaging with CT and bone scan in patients with unfavorable intermediate risk and high risk disease.  I explained that his life expectancy, clinical stage, Gleason score, PSA, and other co-morbidities influence treatment strategies.  We discussed the roles of active surveillance, radiation therapy, surgical therapy with robotic prostatectomy, and hormone therapy with androgen deprivation.  We discussed that patients urinary symptoms also impact treatment strategy, as patients with severe lower urinary tract symptoms may have significant worsening or even develop urinary retention after undergoing radiation.  In regards to surgery, we discussed robotic prostatectomy +/- lymphadenectomy at length.  The procedure takes 3 to 4 hours, and patient's typically discharge home on post-op day #1.  A Foley catheter is left in place for 7 to 10 days to allow for healing of the vesicourethral anastomosis.  There is a small risk of bleeding, infection, damage to surrounding structures or bowel, hernia, DVT/PE, or serious cardiac or pulmonary complications.  We discussed at length post-op side effects including erectile dysfunction, and the importance of pre-operative erectile function on long-term outcomes.  Even with  a nerve sparing approach, there is an approximately 25% rate of permanent erectile dysfunction.  We also discussed postop urinary incontinence at length.  We expect patients to have stress incontinence post-operatively that will improve over period of weeks to months.  Less than 10% of men will require a pad at 1 year after surgery.  Patients will need to avoid heavy lifting and strenuous activity for 3 to 4 weeks, but most men return to their baseline activity status by 6 weeks.  In summary, Jeremiah Perry is a 63 y.o. man with newly diagnosed low risk prostate cancer. He would like to pursue active surveillance.  RTC PSA 4 months, if PSA continues to rise recommend Oncotype GPS testing or MRI for further evaluation   Lawerence Pressman, MD  [facility] [address], [address] [place], [place] [address] [phone]

## 2024-03-03 NOTE — Patient Instructions (Signed)
 You have a low risk prostate cancer that is typically very slow-growing and does not cause symptoms or problems.  There would be a less than 20% chance of this changing and spreading outside of your prostate.  I would recommend active surveillance which involves closely monitoring the PSA, considering an MRI or repeat biopsy in the future if the PSA increases significantly.  Cncer de prstata Prostate Cancer  La prstata es una glndula pequea que produce el lquido que forma el semen (lquido seminal). Est ubicada debajo de la vejiga en los hombres, frente al recto. El cncer de prstata es el crecimiento anormal de clulas en la prstata. Cules son las causas? Se desconoce la causa exacta de esta afeccin. Qu incrementa el riesgo? Es ms probable que contraiga esta afeccin si: Tiene 65 aos o ms. Tiene antecedentes familiares de cncer de prstata. Tiene antecedentes familiares de cncer de mama y cncer de ovario. Tiene genes que se transmiten de padres a hijos (hereditarios), como BRCA1 y BRCA2. Tiene sndrome de Personnel officer. A los hombres afroamericanos y a los hombres de ascendencia africana se les diagnostica cncer de prstata con una frecuencia ms alta que a otros hombres. Los motivos de esto no se conocen bien y [place] se deba a una combinacin de factores genticos y [place]. Cules son los signos o sntomas? Los sntomas de esta afeccin incluyen: Problemas al [employer]. Esto puede incluir: Un flujo de orina dbil o que se interrumpe. Dificultad para comenzar o detener la miccin. Problemas para vaciar la vejiga por completo. Necesidad de Geographical information systems officer con mayor frecuencia, especialmente por la noche. Sangre en la orina o el semen. Dolor o Doctor, hospital en la parte inferior de la espalda, la parte inferior del abdomen o la cadera. Problemas para tener una ereccin. Debilidad o entumecimiento de piernas o pies. Cmo se diagnostica? Esta afeccin se puede  diagnosticar a travs de lo siguiente: Examen rectal digital. En este examen, un mdico introduce un dedo cubierto por un guante en el recto para palpar la prstata. Un anlisis de sangre llamado prueba del antgeno prosttico especfico (PSA). Un procedimiento en el que se toma una muestra de tejido de la prstata y se la analiza con un microscopio (biopsia de prstata). Un estudio de diagnstico por imgenes llamado ecografa transrectal. Ebb Goldman vez que se haya diagnosticado la afeccin, se realizarn pruebas para determinar cunto se ha extendido Management consultant. Esto se llama determinar el estadio del cncer. Determinar el estadio puede incluir estudios de diagnstico por imgenes, como una gammagrafa sea, una exploracin por tomografa computarizada (TC), una tomografa por emisin de positrones (PET) o una resonancia magntica (RM). Estadios del cncer de prstata Estos son los estadios del cncer de prstata: Estadio 1 (I). En este estadio, el cncer se encuentra solo en la prstata. El cncer no aparece en las pruebas de diagnstico por imgenes y, por lo general, se descubre por accidente, por ejemplo, durante una ciruga de prstata. Estadio 2 (II). En este estadio, el cncer est ms avanzado que en el estadio 1, pero no se ha extendido fuera de la prstata. Estadio 3 (III). En este estadio, el cncer se ha extendido fuera de la capa exterior de la prstata hacia los tejidos cercanos. Es posible que se encuentre en las vesculas seminales, que estn cerca de la vejiga y de la prstata. Estadio 4 (IV). En este estadio, el cncer se ha extendido a otras partes del cuerpo, como los [employer], los [place], la vejiga, el recto, el hgado o los  pulmones. Clasificacin del cncer de prstata El cncer de prstata tambin se clasifica segn el aspecto que presentan las clulas cancerosas bajo el microscopio. Esto se denomina puntuacin de Gleason y la puntuacin total puede oscilar entre 6 y 10, lo  que indica la probabilidad de que el cncer se extienda (haga metstasis) a Corporate treasurer del cuerpo. Cuanto mayor es la puntuacin, mayor es la probabilidad de que el cncer se extienda. Gleason 6 o inferior: Esto indica que las clulas cancerosas tienen un aspecto similar al de las clulas prostticas normales (bien diferenciadas). Gleason 7: Esto indica que las clulas cancerosas tienen un aspecto algo similar al de las clulas prostticas normales (moderadamente diferenciadas). Gleason 8, 9 o 10: Esto indica que las clulas cancerosas tienen un aspecto muy diferente al de las clulas prostticas normales (mal diferenciadas). Cmo se trata? El tratamiento de esta afeccin depende de varios factores, incluidos el estadio del cncer, la edad, las preferencias personales y el estado de salud general. Hable con su mdico sobre cules alternativas de tratamiento son buenas para usted. Los tratamientos frecuentes son los siguientes: Observacin en el caso del cncer de prstata en estadio temprano (vigilancia activa). Esto implica realizarse exmenes, anlisis de [place] y, en algunos [place], otras biopsias. En el caso de ciertos hombres, este es el nico tratamiento necesario. Ciruga. Tipos de ciruga: Ciruga abierta (prostatectoma radical). En esta ciruga, se realiza una incisin grande para extirpar la prstata. Prostatectoma radical laparoscpica. Es Ebb Goldman ciruga para extirpar la prstata y los ganglios linfticos a travs de varias incisiones pequeas. A esta ciruga a menudo se la considera como mnimamente invasiva. Prostatectoma radical robtica. Es Ebb Goldman ciruga laparoscpica que se realiza para extirpar la prstata y los ganglios linfticos con la ayuda de brazos robticos que controla el [place]. Crioablacin. Es una ciruga en la que se congelan y destruyen las clulas cancerosas. Radioterapia. Tipos de tratamiento por radiacin: Radiacin externa. Apunta haces de radiacin desde afuera del  cuerpo hacia la prstata para destruir las clulas cancerosas. Braquiterapia. Utiliza agujas, semillas, cables o sondas radioactivos que se implantan en la prstata. Al igual que la radiacin externa, la braquiterapia destruye las clulas cancerosas. Una ventaja es que este tipo de radiacin limita el dao en los tejidos circundantes y tiene menos efectos secundarios. Quimioterapia. Este tratamiento destruye las clulas cancerosas o evita que se multipliquen. Destruye tanto las clulas cancerosas como las clulas normales. Terapia dirigida. En este tratamiento, se utilizan medicamentos para destruir las clulas cancerosas sin daar las clulas normales. Tratamiento hormonal. Este tratamiento implica el uso de medicamentos que actan sobre la [place], una de las hormonas [place], al: Automotive engineer que el cuerpo produzca testosterona. Evitar que la testosterona llegue hasta las clulas cancerosas. Siga estas instrucciones en su casa: Estilo de vida No consuma ningn producto que contenga nicotina o tabaco. Estos productos incluyen cigarrillos, tabaco para Theatre manager y aparatos de vapeo, como los cigarrillos electrnicos. Si necesita ayuda para dejar de fumar, consulte al mdico. Siga una dieta saludable. Para hacer esto: Comer alimentos ricos en fibra. Entre ellos, frijoles, cereales integrales y frutas y verduras frescas. Limitar los alimentos con alto contenido de grasa y International aid/development worker. Estos incluyen alimentos fritos o dulces. El tratamiento contra el cncer de prstata podra afectar la funcin sexual. Si tiene pareja, contine teniendo momentos ntimos. Esto podra incluir tocar, abrazar y acariciar a su pareja. Duerma lo suficiente. Considere unirse a un grupo de apoyo para hombres que tienen cncer de prstata. Reunirse con un grupo de apoyo puede ayudarle a  aprender cmo lidiar con el estrs de Hotel manager. Instrucciones generales Use los medicamentos de venta libre y los recetados solamente como se lo  haya indicado el mdico. Si tiene que ir a un hospital, infrmeselo al Agricultural engineer (onclogo). Concurra a todas las visitas de seguimiento. Esto es importante. Dnde buscar ms informacin American Cancer Society (Sociedad Estadounidense del Cncer): www.cancer.org American Society of Clinical Oncology (Sociedad Estadounidense de Oncologa Clnica): www.cancer.net [employer] (Instituto Nacional del Cncer): www.cancer.gov Comunquese con un mdico si: Tiene mayor dificultad para orinar o comienza a tener dificultad para Geographical information systems officer. Le aparece sangre en la orina o hay ms [employer] orina. Empieza a sentir [employer] cadera, la espalda o el pecho, o el dolor que ya tena es mayor. Solicite ayuda de inmediato si: Siente debilidad o adormecimiento en las piernas. No puede controlar la miccin o las deposiciones (incontinencia). Siente escalofros o tiene fiebre. Resumen La prstata es una glndula pequea que interviene en la produccin de semen. Est ubicada debajo de la vejiga del hombre, frente al recto. El cncer de prstata es el crecimiento anormal de clulas en la prstata. El tratamiento de esta afeccin depende del estadio del cncer, la edad, las preferencias personales y el estado de salud general. Hable con su mdico sobre cules alternativas de tratamiento son buenas para usted. Considere unirse a un grupo de apoyo para hombres que tienen cncer de prstata. Reunirse con un grupo de apoyo puede ayudarle a aprender cmo lidiar con el estrs de Hotel manager. Esta informacin no tiene Theme park manager el consejo del mdico. Asegrese de hacerle al mdico cualquier pregunta que tenga. Document Revised: 01/07/2021 Document Reviewed: 01/07/2021 Elsevier Patient Education  2024 [employer].

## 2024-07-06 ENCOUNTER — Other Ambulatory Visit: Payer: Self-pay

## 2024-07-06 DIAGNOSIS — C61 Malignant neoplasm of prostate: Secondary | ICD-10-CM

## 2024-07-07 ENCOUNTER — Ambulatory Visit: Admitting: Urology

## 2024-07-07 ENCOUNTER — Encounter: Payer: Self-pay | Admitting: Urology

## 2024-11-04 ENCOUNTER — Other Ambulatory Visit

## 2024-11-04 DIAGNOSIS — C61 Malignant neoplasm of prostate: Secondary | ICD-10-CM

## 2024-11-05 LAB — PSA: Prostate Specific Ag, Serum: 6.7 ng/mL — ABNORMAL HIGH (ref 0.0–4.0)

## 2024-11-10 ENCOUNTER — Ambulatory Visit: Admitting: Urology
